# Patient Record
Sex: Male | Born: 1970 | Race: Asian | Hispanic: No | Marital: Married
Health system: Southern US, Community
[De-identification: ages and names within clinical notes are randomized; demographics above are authoritative.]

---

## 2016-07-30 DIAGNOSIS — S86811A Strain of other muscle(s) and tendon(s) at lower leg level, right leg, initial encounter: Secondary | ICD-10-CM | POA: Diagnosis not present

## 2016-07-31 ENCOUNTER — Other Ambulatory Visit (HOSPITAL_COMMUNITY): Payer: Self-pay | Admitting: Family Medicine

## 2016-07-31 ENCOUNTER — Ambulatory Visit (HOSPITAL_COMMUNITY)
Admission: RE | Admit: 2016-07-31 | Discharge: 2016-07-31 | Disposition: A | Discharge status: Home / Self Care | Payer: BLUE CROSS/BLUE SHIELD | Source: Ambulatory Visit | Attending: Family Medicine | Admitting: Family Medicine

## 2016-07-31 DIAGNOSIS — M7989 Other specified soft tissue disorders: Secondary | ICD-10-CM | POA: Diagnosis not present

## 2016-07-31 DIAGNOSIS — M79604 Pain in right leg: Secondary | ICD-10-CM

## 2016-07-31 DIAGNOSIS — M79661 Pain in right lower leg: Secondary | ICD-10-CM | POA: Insufficient documentation

## 2016-07-31 NOTE — Progress Notes (Signed)
**  Preliminary report by tech**  Right lower extremity venous duplex complete. There is no evidence of deep or superficial vein thrombosis involving the right lower extremity. All visualized vessels appear patent and compressible. There is no evidence of a Baker's cyst on the right. Results were given to Deanna at Dr. Jolayne PantherYousef's office.  07/31/16 5:03 PM Olen CordialGreg Braven Wolk RVT

## 2016-11-26 ENCOUNTER — Other Ambulatory Visit: Payer: Self-pay | Admitting: Family Medicine

## 2016-11-26 ENCOUNTER — Ambulatory Visit
Admission: RE | Admit: 2016-11-26 | Discharge: 2016-11-26 | Disposition: A | Discharge status: Home / Self Care | Payer: BLUE CROSS/BLUE SHIELD | Source: Ambulatory Visit | Attending: Family Medicine | Admitting: Family Medicine

## 2016-11-26 DIAGNOSIS — R52 Pain, unspecified: Secondary | ICD-10-CM

## 2016-11-26 DIAGNOSIS — M25521 Pain in right elbow: Secondary | ICD-10-CM | POA: Diagnosis not present

## 2016-12-13 DIAGNOSIS — M7711 Lateral epicondylitis, right elbow: Secondary | ICD-10-CM | POA: Diagnosis not present

## 2017-01-28 DIAGNOSIS — Z13220 Encounter for screening for lipoid disorders: Secondary | ICD-10-CM | POA: Diagnosis not present

## 2017-01-28 DIAGNOSIS — Z0000 Encounter for general adult medical examination without abnormal findings: Secondary | ICD-10-CM | POA: Diagnosis not present

## 2017-01-28 DIAGNOSIS — Z6826 Body mass index (BMI) 26.0-26.9, adult: Secondary | ICD-10-CM | POA: Diagnosis not present

## 2017-01-28 DIAGNOSIS — Z1331 Encounter for screening for depression: Secondary | ICD-10-CM | POA: Diagnosis not present

## 2017-04-18 DIAGNOSIS — J301 Allergic rhinitis due to pollen: Secondary | ICD-10-CM | POA: Diagnosis not present

## 2017-04-18 DIAGNOSIS — J4520 Mild intermittent asthma, uncomplicated: Secondary | ICD-10-CM | POA: Diagnosis not present

## 2017-04-25 DIAGNOSIS — J4521 Mild intermittent asthma with (acute) exacerbation: Secondary | ICD-10-CM | POA: Diagnosis not present

## 2017-06-13 DIAGNOSIS — M722 Plantar fascial fibromatosis: Secondary | ICD-10-CM | POA: Diagnosis not present

## 2017-07-04 ENCOUNTER — Ambulatory Visit (INDEPENDENT_AMBULATORY_CARE_PROVIDER_SITE_OTHER): Payer: BLUE CROSS/BLUE SHIELD | Admitting: Podiatry

## 2017-07-04 ENCOUNTER — Ambulatory Visit (INDEPENDENT_AMBULATORY_CARE_PROVIDER_SITE_OTHER): Payer: BLUE CROSS/BLUE SHIELD

## 2017-07-04 ENCOUNTER — Encounter: Payer: Self-pay | Admitting: Podiatry

## 2017-07-04 DIAGNOSIS — M7732 Calcaneal spur, left foot: Secondary | ICD-10-CM

## 2017-07-04 DIAGNOSIS — M722 Plantar fascial fibromatosis: Secondary | ICD-10-CM

## 2017-07-04 MED ORDER — MELOXICAM 15 MG PO TABS: 15.0000 mg | ORAL_TABLET | Freq: Every day | ORAL | 0 refills | Status: AC

## 2017-07-04 NOTE — Progress Notes (Signed)
   Subjective:    Patient ID: Alex Fry, male    DOB: 1970/08/01, 47 y.o.   MRN: [id number]  HPI 47 year old male presents the office today for concerns of left heel pain which is been ongoing for the last 2 to 3 months.  He states the pain is a gradual onset and is been getting more consistent pain to the bottom of his heel.  He denies any recent injury or trauma denies any numbness or tingling.  It hurts more in the morning and gets up and become more consistent.  He said no treatment.  He has no other concerns today.  He does not wake him up at nighttime.   Review of Systems  All other systems reviewed and are negative.  History reviewed. No pertinent past medical history.  History reviewed. No pertinent surgical history.   Current Outpatient Medications:  .  atorvastatin (LIPITOR) 40 MG tablet, Take by mouth., Disp: , Rfl:  .  albuterol (PROVENTIL HFA;VENTOLIN HFA) 108 (90 Base) MCG/ACT inhaler, , Disp: , Rfl:  .  ALLERGY 10 MG tablet, Take 10 mg by mouth daily., Disp: , Rfl: 1 .  Fluticasone-Salmeterol (ADVAIR DISKUS) 100-50 MCG/DOSE AEPB, Inhale into the lungs., Disp: , Rfl:  .  meloxicam (MOBIC) 15 MG tablet, Take 1 tablet (15 mg total) by mouth daily., Disp: 14 tablet, Rfl: 0 .  predniSONE (DELTASONE) 10 MG tablet, TAPER: 6/6/5/5/4/4/3/3/2/2/1/1 ONCE A DAY ORALLY 12 DAYS, Disp: , Rfl: 0  No Known Allergies       Objective:   Physical Exam  General: AAO x3, NAD-presents today with his son.  He seemed to understand getting worse but his son would interpret some for him.  Dermatological: Skin is warm, dry and supple bilateral. Nails x 10 are well manicured; remaining integument appears unremarkable at this time. There are no open sores, no preulcerative lesions, no rash or signs of infection present.  Vascular: Dorsalis Pedis artery and Posterior Tibial artery pedal pulses are 2/4 bilateral with immedate capillary fill time. Pedal hair growth present. No varicosities and  no lower extremity edema present bilateral. There is no pain with calf compression, swelling, warmth, erythema.   Neruologic: Grossly intact via light touch bilateral.  Protective threshold with Semmes Wienstein monofilament intact to all pedal sites bilateral.  Negative Tinel sign.  Musculoskeletal: There is tenderness palpation on the plantar aspect of the left calcaneus and along the plantar medial tubercle of the calcaneus at the insertion of plantar fascia.  The fascia appears to be intact.  No pain to the posterior calcaneus and there is no pain on the Achilles tendon.  Achilles tendon appears to be intact.  There is no edema, erythema, increase in warmth.  No other areas of tenderness.  Range of motion intact.  Muscular strength 5/5 in all groups tested bilateral.  Gait: Unassisted, Nonantalgic.     Assessment & Plan:  47 year old male with left heel pain, plantar fasciitis/heel spur -Treatment options discussed including all alternatives, risks, and complications -Etiology of symptoms were discussed -X-rays were obtained and reviewed.  No evidence of acute fracture.  Inferior calcaneal spurring is present. -Discussed steroid injection he declined. -Prescribed mobic. Discussed side effects of the medication and directed to stop if any are to occur and call the office.  -Stretching, icing exercises daily.  Discussed changing shoes as well as orthotics. -Plantar fascial brace was dispensed.   Vivi BarrackMatthew R Wagoner DPM

## 2017-07-04 NOTE — Patient Instructions (Signed)

## 2017-07-06 DIAGNOSIS — M7732 Calcaneal spur, left foot: Secondary | ICD-10-CM | POA: Insufficient documentation

## 2017-07-11 DIAGNOSIS — M545 Low back pain: Secondary | ICD-10-CM | POA: Diagnosis not present

## 2017-08-08 ENCOUNTER — Ambulatory Visit (INDEPENDENT_AMBULATORY_CARE_PROVIDER_SITE_OTHER): Payer: BLUE CROSS/BLUE SHIELD | Admitting: Podiatry

## 2017-08-08 DIAGNOSIS — M722 Plantar fascial fibromatosis: Secondary | ICD-10-CM

## 2017-08-08 NOTE — Patient Instructions (Signed)
.  tfc  

## 2017-08-12 NOTE — Progress Notes (Signed)
Subjective: 47 year old male presents the office today for follow-up evaluation of left heel pain, plantar fasciitis.  States she is doing much better now has very minimal pain at this time.  Is been doing stretching, icing daily as well as using a plantar fascial brace.  Is been taking meloxicam as needed.  He has had no side effects of medication.  No other concerns. Denies any systemic complaints such as fevers, chills, nausea, vomiting. No acute changes since last appointment, and no other complaints at this time.   Objective: AAO x3, NAD DP/PT pulses palpable bilaterally, CRT less than 3 seconds At this time there is very minimal tenderness palpation on the plantar medial tubercle of the calcaneus at the insertion of plantar fashion the left foot.  No pain on the course of plantar fascia the arch of the foot.  Plantar fascia appears to be intact.  There is no pain with lateral compression of calcaneus.  No other areas of tenderness.  No open lesions or pre-ulcerative lesions.  No pain with calf compression, swelling, warmth, erythema  Assessment: Resolving left  Plantar fasciitis  Plan: -All treatment options discussed with the patient including all alternatives, risks, complications.  -Recommend continue stretching, icing daily as well as wearing supportive shoes we discussed orthotics.  And continue the brace for now as well.  Meloxicam as needed.  Follow-up next 4 weeks if symptoms continue or if there is any worsening and he agrees with this plan otherwise we will see him back as needed as he is doing much better today. -Patient encouraged to call the office with any questions, concerns, change in symptoms.   Vivi BarrackMatthew R Jorden Mahl DPM

## 2017-11-25 DIAGNOSIS — M25522 Pain in left elbow: Secondary | ICD-10-CM | POA: Diagnosis not present

## 2017-11-25 DIAGNOSIS — M545 Low back pain: Secondary | ICD-10-CM | POA: Diagnosis not present

## 2018-03-31 IMAGING — CR DG ELBOW 2V*R*
2 series · 2 of 2 positions shown · non-contrast
Comparison: None.

CLINICAL DATA: Right elbow pain for 2 months.

EXAM:
RIGHT ELBOW - 2 VIEW

[x elbow ap right]
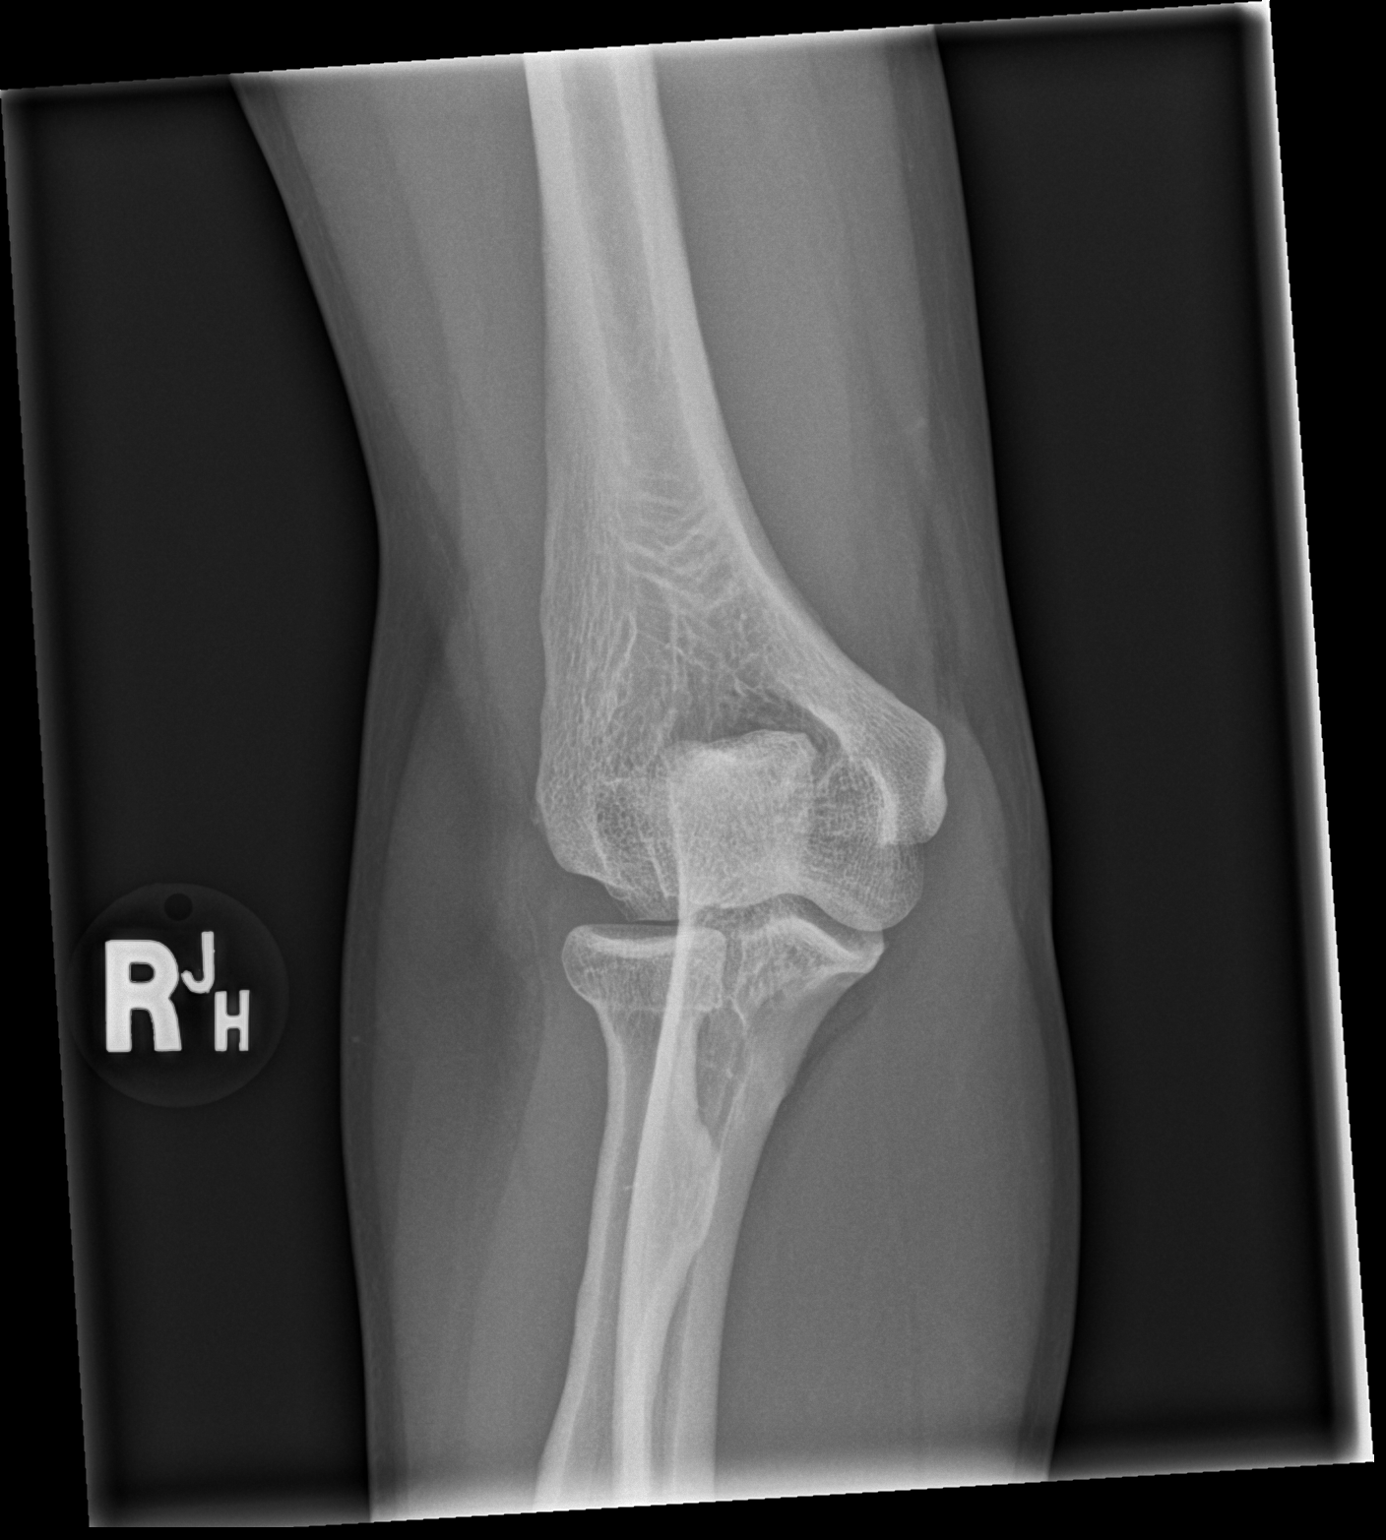

[x elbow lat right]
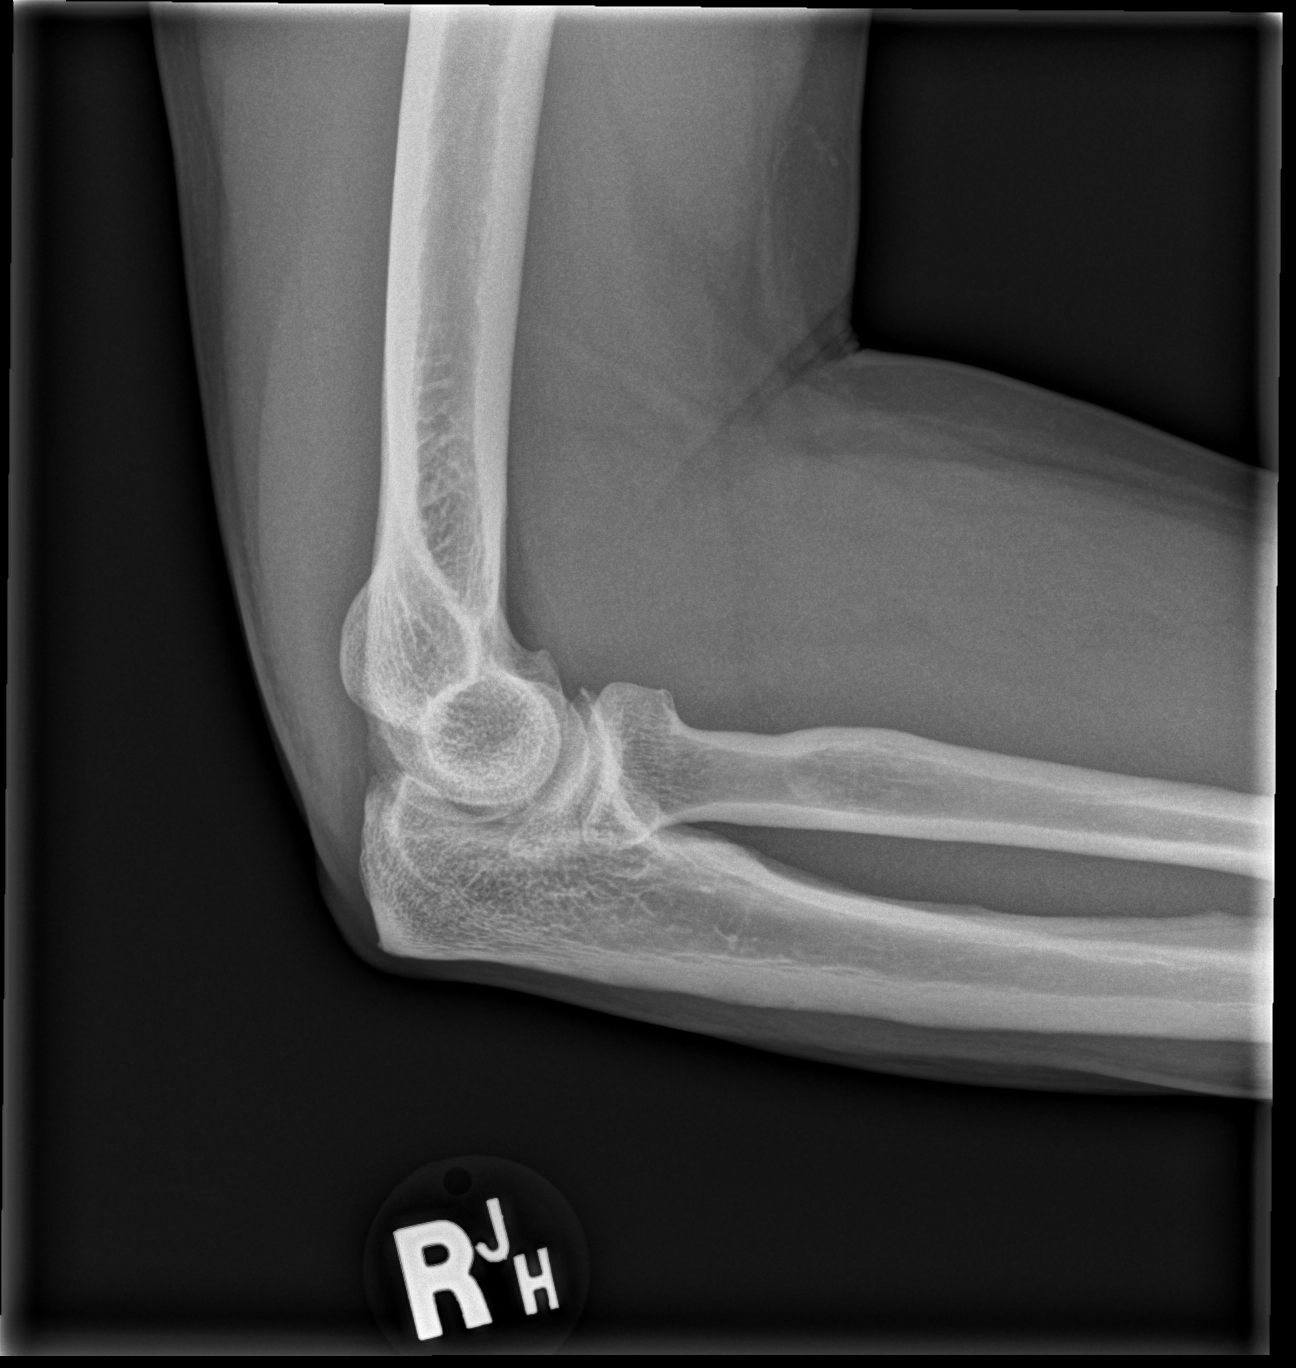

[2 of 2 positions shown; findings below may reference images not displayed]

FINDINGS: There is no evidence of fracture, dislocation, or joint effusion.
There is no evidence of arthropathy or other focal bone abnormality.
Soft tissues are unremarkable.
IMPRESSION: Normal right elbow.

## 2020-12-15 ENCOUNTER — Other Ambulatory Visit (HOSPITAL_BASED_OUTPATIENT_CLINIC_OR_DEPARTMENT_OTHER): Payer: Self-pay

## 2020-12-15 DIAGNOSIS — G4710 Hypersomnia, unspecified: Secondary | ICD-10-CM

## 2020-12-15 DIAGNOSIS — R0683 Snoring: Secondary | ICD-10-CM

## 2020-12-15 DIAGNOSIS — R0681 Apnea, not elsewhere classified: Secondary | ICD-10-CM
